# Patient Record
Sex: Female | Born: 1985 | Race: White | Hispanic: No | Marital: Married | Smoking: Never smoker
Health system: Southern US, Community
[De-identification: ages and names within clinical notes are randomized; demographics above are authoritative.]

## PROBLEM LIST (undated history)

## (undated) DIAGNOSIS — F419 Anxiety disorder, unspecified: Secondary | ICD-10-CM

## (undated) DIAGNOSIS — T7840XA Allergy, unspecified, initial encounter: Secondary | ICD-10-CM

## (undated) HISTORY — DX: Anxiety disorder, unspecified: F41.9

## (undated) HISTORY — DX: Allergy, unspecified, initial encounter: T78.40XA

## (undated) HISTORY — PX: CHOLECYSTECTOMY: SHX55

---

## 2013-05-28 ENCOUNTER — Other Ambulatory Visit: Payer: Self-pay | Admitting: Gastroenterology

## 2013-05-28 DIAGNOSIS — R110 Nausea: Secondary | ICD-10-CM

## 2013-06-07 ENCOUNTER — Encounter (HOSPITAL_COMMUNITY)
Admission: RE | Admit: 2013-06-07 | Discharge: 2013-06-07 | Disposition: A | Discharge status: Home / Self Care | Payer: 59 | Source: Ambulatory Visit | Attending: Gastroenterology | Admitting: Gastroenterology

## 2013-06-07 ENCOUNTER — Ambulatory Visit (HOSPITAL_COMMUNITY)
Admission: RE | Admit: 2013-06-07 | Discharge: 2013-06-07 | Disposition: A | Discharge status: Home / Self Care | Payer: 59 | Source: Ambulatory Visit | Attending: Gastroenterology | Admitting: Gastroenterology

## 2013-06-07 DIAGNOSIS — R1033 Periumbilical pain: Secondary | ICD-10-CM | POA: Insufficient documentation

## 2013-06-07 DIAGNOSIS — R110 Nausea: Secondary | ICD-10-CM | POA: Insufficient documentation

## 2013-06-07 MED ORDER — TECHNETIUM TC 99M MEBROFENIN IV KIT
5.0000 | PACK | Freq: Once | INTRAVENOUS | Status: AC | PRN
  Administered 2013-06-07: 5 via INTRAVENOUS

## 2013-06-07 MED ORDER — SINCALIDE 5 MCG IJ SOLR: 0.0200 ug/kg | Freq: Once | INTRAMUSCULAR | Status: DC

## 2013-06-07 MED ORDER — SINCALIDE 5 MCG IJ SOLR
INTRAMUSCULAR | Status: AC
  Administered 2013-06-07: 1.2 ug via INTRAVENOUS
  Filled 2013-06-07: qty 5

## 2013-06-14 ENCOUNTER — Encounter (INDEPENDENT_AMBULATORY_CARE_PROVIDER_SITE_OTHER): Payer: Self-pay | Admitting: Surgery

## 2013-06-27 ENCOUNTER — Ambulatory Visit (INDEPENDENT_AMBULATORY_CARE_PROVIDER_SITE_OTHER): Payer: 59 | Admitting: Surgery

## 2013-06-28 ENCOUNTER — Ambulatory Visit (INDEPENDENT_AMBULATORY_CARE_PROVIDER_SITE_OTHER): Payer: 59 | Admitting: Surgery

## 2013-06-28 ENCOUNTER — Encounter (INDEPENDENT_AMBULATORY_CARE_PROVIDER_SITE_OTHER): Payer: Self-pay | Admitting: Surgery

## 2013-06-28 VITALS — BP 102/66 | HR 92 | Temp 98.2°F | Resp 14 | Ht 64.0 in | Wt 128.0 lb

## 2013-06-28 DIAGNOSIS — K828 Other specified diseases of gallbladder: Secondary | ICD-10-CM

## 2013-06-28 NOTE — Progress Notes (Signed)
Patient ID: Katie Sosa, female   DOB: 05-May-1986, 27 y.o.   MRN: [id number]  Chief Complaint  Patient presents with  . New Evaluation    eval GB    HPI Katie Sosa is a 27 y.o. female.   HPI This is a very pleasant female referred by Dr. Arty Baumgartner for evaluation of nausea and right upper quadrant abdominal pain. She has been having this daily for last 4 months. She started having symptoms of nausea after fatty meals about a year ago. The pain is only mild. The nausea is the more significant symptoms. She denies vomiting. She has intermittent constipation and diarrhea. She is otherwise without complaints  History reviewed. No pertinent past medical history.  History reviewed. No pertinent past surgical history.  Family History  Problem Relation Age of Onset  . Cancer Father     prostate  . Cancer Paternal Grandmother     breast    Social History History  Substance Use Topics  . Smoking status: Never Smoker   . Smokeless tobacco: Never Used  . Alcohol Use: Yes     Comment: occasionally    No Known Allergies  Current Outpatient Prescriptions  Medication Sig Dispense Refill  . loratadine (CLARITIN) 10 MG tablet Take 10 mg by mouth daily.      . norethindrone-ethinyl estradiol 1/35 (ORTHO-NOVUM, NORTREL,CYCLAFEM) tablet Take 1 tablet by mouth daily.       No current facility-administered medications for this visit.    Review of Systems Review of Systems  Constitutional: Negative for fever, chills and unexpected weight change.  HENT: Negative for hearing loss, congestion, sore throat, trouble swallowing and voice change.   Eyes: Negative for visual disturbance.  Respiratory: Negative for cough and wheezing.   Cardiovascular: Negative for chest pain, palpitations and leg swelling.  Gastrointestinal: Positive for nausea, abdominal pain, diarrhea and constipation. Negative for vomiting, blood in stool, abdominal distention and anal bleeding.  Genitourinary: Negative for  hematuria, vaginal bleeding and difficulty urinating.  Musculoskeletal: Negative for arthralgias.  Skin: Negative for rash and wound.  Neurological: Negative for seizures, syncope and headaches.  Hematological: Negative for adenopathy. Does not bruise/bleed easily.  Psychiatric/Behavioral: Negative for confusion.    Blood pressure 102/66, pulse 92, temperature 98.2 F (36.8 C), temperature source Temporal, resp. rate 14, height 5\' 4"  (1.626 m), weight 128 lb (58.06 kg), last menstrual period 05/11/2013.  Physical Exam Physical Exam  Constitutional: She is oriented to person, place, and time. She appears well-developed and well-nourished. No distress.  HENT:  Head: Normocephalic and atraumatic.  Right Ear: External ear normal.  Left Ear: External ear normal.  Nose: Nose normal.  Mouth/Throat: Oropharynx is clear and moist. No oropharyngeal exudate.  Eyes: Conjunctivae are normal. Pupils are equal, round, and reactive to light. Right eye exhibits no discharge. Left eye exhibits no discharge. No scleral icterus.  Neck: Normal range of motion. Neck supple. No tracheal deviation present. No thyromegaly present.  Cardiovascular: Normal rate, regular rhythm, normal heart sounds and intact distal pulses.   No murmur heard. Pulmonary/Chest: Effort normal and breath sounds normal. No respiratory distress. She has no wheezes.  Abdominal: Soft. Bowel sounds are normal. She exhibits no distension. There is no tenderness. There is no rebound.  Musculoskeletal: Normal range of motion. She exhibits no edema and no tenderness.  Neurological: She is alert and oriented to person, place, and time.  Skin: Skin is warm and dry. No rash noted. She is not diaphoretic. No erythema.  Psychiatric: Her behavior is normal. Judgment normal.    Data Reviewed I have the notes from her gastroenterologist. Her upper endoscopy was unremarkable. Her ultrasound shows no gallstones. Her HIDA scan shows a gallbladder  ejection fraction of 16%  Assessment    Biliary dyskinesia and I suspect chronic cholecystitis     Plan    I discussed biliary dyskinesia with the patient and gave her literature regarding surgery. I recommend laparoscopic cholecystectomy. I discussed this with her in detail. I discussed the risk of surgery which includes but is not limited to bleeding, infection, bile duct injury, bile leak, injury to other structures, the need to convert to an open procedure, and the chance this may not resolve her symptoms. She understands and wishes to proceed. Surgery will be scheduled.  I also discussed postoperative recovery        Amaranta Mehl A 06/28/2013, 2:16 PM

## 2013-07-01 ENCOUNTER — Encounter (INDEPENDENT_AMBULATORY_CARE_PROVIDER_SITE_OTHER): Payer: Self-pay | Admitting: Surgery

## 2013-07-01 ENCOUNTER — Telehealth (INDEPENDENT_AMBULATORY_CARE_PROVIDER_SITE_OTHER): Payer: Self-pay | Admitting: General Surgery

## 2013-07-01 NOTE — Telephone Encounter (Signed)
Called in Rx into the [facility] on [address] [phone]. Called in per Dr Magnus Ivan Zofran 4 mg 1 po q 4 to 6 hours prn nausea #20 with one refill. Called and LMOM on patient home phone that I called in her Rx

## 2013-07-10 ENCOUNTER — Encounter (INDEPENDENT_AMBULATORY_CARE_PROVIDER_SITE_OTHER): Payer: Self-pay

## 2013-07-18 ENCOUNTER — Other Ambulatory Visit (INDEPENDENT_AMBULATORY_CARE_PROVIDER_SITE_OTHER): Payer: Self-pay | Admitting: Surgery

## 2013-07-18 ENCOUNTER — Other Ambulatory Visit (INDEPENDENT_AMBULATORY_CARE_PROVIDER_SITE_OTHER): Payer: Self-pay | Admitting: *Deleted

## 2013-07-18 DIAGNOSIS — K811 Chronic cholecystitis: Secondary | ICD-10-CM

## 2013-07-18 MED ORDER — HYDROCODONE-ACETAMINOPHEN 5-325 MG PO TABS: 1.0000 | ORAL_TABLET | ORAL | Status: DC | PRN

## 2013-07-22 ENCOUNTER — Telehealth (INDEPENDENT_AMBULATORY_CARE_PROVIDER_SITE_OTHER): Payer: Self-pay | Admitting: General Surgery

## 2013-07-22 NOTE — Telephone Encounter (Signed)
Pt called for reassurance regarding steri-strips coming off and appearance of umbilical wound.  She describes normal incisional appearance at this time.

## 2013-07-23 ENCOUNTER — Telehealth (INDEPENDENT_AMBULATORY_CARE_PROVIDER_SITE_OTHER): Payer: Self-pay | Admitting: *Deleted

## 2013-07-23 NOTE — Telephone Encounter (Signed)
Patient called to states some difficulty with nausea.  Patient states she still has a prescription for Zofran but has just been taking it when she feels nauseated.  Suggested to patient to go ahead and take every 6 hours as it was prescribed then once the nausea is under control she can wean her self down off of it.  Patient states understanding and agreeable with plan at this time.

## 2013-08-01 ENCOUNTER — Encounter (INDEPENDENT_AMBULATORY_CARE_PROVIDER_SITE_OTHER): Payer: Self-pay | Admitting: Surgery

## 2013-08-01 ENCOUNTER — Ambulatory Visit (INDEPENDENT_AMBULATORY_CARE_PROVIDER_SITE_OTHER): Payer: BC Managed Care – PPO | Admitting: Surgery

## 2013-08-01 VITALS — BP 118/76 | HR 80 | Resp 16 | Ht 64.0 in | Wt 126.2 lb

## 2013-08-01 DIAGNOSIS — Z09 Encounter for follow-up examination after completed treatment for conditions other than malignant neoplasm: Secondary | ICD-10-CM

## 2013-08-01 NOTE — Progress Notes (Signed)
Subjective:     Patient ID: Katie Sosa, female   DOB: 26-Jun-1986, 26 y.o.   MRN: [id number]  HPI She is here for her first postop visit status post laparoscopic cholecystectomy. She is doing well. She had some nausea postoperatively but this is now resolving.  Review of Systems     Objective:   Physical Exam On exam, her incisions are well healed. The final pathology showed chronic cholecystitis    Assessment:     Patient stable postop     Plan:     She may resume normal activity. I will see her back as needed

## 2013-08-29 ENCOUNTER — Other Ambulatory Visit: Payer: Self-pay

## 2013-09-29 ENCOUNTER — Ambulatory Visit (INDEPENDENT_AMBULATORY_CARE_PROVIDER_SITE_OTHER): Payer: BC Managed Care – PPO | Admitting: Family Medicine

## 2013-09-29 VITALS — BP 116/70 | HR 89 | Temp 98.1°F | Resp 18 | Ht 63.5 in | Wt 123.8 lb

## 2013-09-29 DIAGNOSIS — K219 Gastro-esophageal reflux disease without esophagitis: Secondary | ICD-10-CM

## 2013-09-29 DIAGNOSIS — J309 Allergic rhinitis, unspecified: Secondary | ICD-10-CM

## 2013-09-29 DIAGNOSIS — R079 Chest pain, unspecified: Secondary | ICD-10-CM

## 2013-09-29 MED ORDER — FLUTICASONE PROPIONATE 50 MCG/ACT NA SUSP: 2.0000 | Freq: Every day | NASAL | Status: AC

## 2013-09-29 NOTE — Progress Notes (Signed)
Chief Complaint:  Chief Complaint  Patient presents with  . Sore Throat    pain in throat and down into chest, "burning" started on Monday    HPI: Katie Sosa is a 27 y.o. female who is here for  Burning and dull pain on chest with deep breaths. SHe has a history of GERD and thought it was GERD  and she was feeling better after a few days from that.  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 There is a lot of stress Nagging burning pain. No history of MI/PE. No other sxs: SOB, palpitations,wheezing, SOB,  n/v, no sick sxs ie fevers, chills, ear pain, sinus pressure, HAs, cough. Has a history of GERD, allergies. On claritin, on OCP. Low risk for PE.   Past Medical History  Diagnosis Date  . Allergy   . Anxiety    Past Surgical History  Procedure Laterality Date  . Cholecystectomy     History   Social History  . Marital Status: Married    Spouse Name: N/A    Number of Children: N/A  . Years of Education: N/A   Social History Main Topics  . Smoking status: Never Smoker   . Smokeless tobacco: Never Used  . Alcohol Use: Yes     Comment: occasionally  . Drug Use: No  . Sexual Activity: None   Other Topics Concern  . None   Social History Narrative  . None   Family History  Problem Relation Age of Onset  . Cancer Father     prostate  . Cancer Paternal Grandmother     breast  . Stroke Paternal Grandmother   . Hyperlipidemia Mother   . Hearing loss Paternal Grandfather   . Stroke Paternal Grandfather    No Known Allergies Prior to Admission medications   Medication Sig Start Date End Date Taking? Authorizing Provider  loratadine (CLARITIN) 10 MG tablet Take 10 mg by mouth daily.   Yes Historical Provider, MD  norethindrone-ethinyl estradiol 1/35 (ORTHO-NOVUM, NORTREL,CYCLAFEM) tablet Take 1 tablet by mouth daily.   Yes Historical Provider, MD  Probiotic Product (PROBIOTIC DAILY PO) Take by mouth.   Yes  Historical Provider, MD  fluticasone (FLONASE) 50 MCG/ACT nasal spray Place 2 sprays into both nostrils daily. 09/29/13   Elmon Shader P Delvecchio Madole, DO  ondansetron (ZOFRAN) 4 MG tablet Take 4 mg by mouth 4 (four) times daily.    Historical Provider, MD     ROS: The patient denies fevers, chills, night sweats, unintentional weight loss, palpitations, wheezing, dyspnea on exertion, nausea, vomiting, abdominal pain, dysuria, hematuria, melena, numbness, weakness, or tingling.   All other systems have been reviewed and were otherwise negative with the exception of those mentioned in the HPI and as above.    PHYSICAL EXAM: Filed Vitals:   09/29/13 1435  BP: 116/70  Pulse: 89  Temp: 98.1 F (36.7 C)  Resp: 18   Filed Vitals:   09/29/13 1435  Height: 5' 3.5" (1.613 m)  Weight: 123 lb 12.8 oz (56.155 kg)   Body mass index is 21.58 kg/(m^2).  General: Alert, no acute distress HEENT:  Normocephalic, atraumatic, oropharynx patent. EOMI, PERRLA, boggy nares, TM nl, sinus nontender Cardiovascular:  Regular rate and rhythm, no rubs murmurs or gallops.  No Carotid bruits, radial pulse intact. No pedal edema.  Respiratory: Clear to auscultation bilaterally.  No wheezes, rales, or rhonchi.  No cyanosis, no use  of accessory musculature GI: No organomegaly, abdomen is soft and non-tender, positive bowel sounds.  No masses. Skin: No rashes. Neurologic: Facial musculature symmetric. Psychiatric: Patient is appropriate throughout our interaction. Lymphatic: No cervical lymphadenopathy Musculoskeletal: Gait intact.   LABS: No results found for this or any previous visit.   EKG/XRAY:   Primary read interpreted by Dr. Conley Rolls at [facility].   ASSESSMENT/PLAN: Encounter Diagnoses  Name Primary?  . Chest pain Yes  . Allergic rhinitis   . GERD (gastroesophageal reflux disease)    OTC GERD medicine Rx flonase C/w claritin F/u prn Precautions given for worsening sxs, return to office or go to ER if have CP/SOB, rule  out PE/MI Low risk factors for both   Gross sideeffects, risk and benefits, and alternatives of medications d/w patient. Patient is aware that all medications have potential sideeffects and we are unable to predict every sideeffect or drug-drug interaction that may occur.  Shonika Kolasinski PHUONG, DO 10/02/2013 10:49 AM

## 2013-10-30 ENCOUNTER — Ambulatory Visit: Payer: BC Managed Care – PPO | Admitting: Family Medicine

## 2013-10-30 VITALS — BP 122/70 | HR 93 | Temp 98.5°F | Resp 18 | Ht 64.5 in | Wt 124.0 lb

## 2013-10-30 DIAGNOSIS — G43109 Migraine with aura, not intractable, without status migrainosus: Secondary | ICD-10-CM

## 2013-10-30 LAB — POCT URINE PREGNANCY: PREG TEST UR: NEGATIVE

## 2013-10-30 MED ORDER — NORETHINDRONE 0.35 MG PO TABS: 1.0000 | ORAL_TABLET | Freq: Every day | ORAL | Status: AC

## 2013-10-30 MED ORDER — BUTALBITAL-APAP-CAFFEINE 50-325-40 MG PO TABS: 1.0000 | ORAL_TABLET | Freq: Four times a day (QID) | ORAL | Status: AC | PRN

## 2013-10-30 NOTE — Patient Instructions (Signed)
We are going to change to a progesterone only contraceptive pill due to your migraine with aura.  Be sure to take your new pill around the same time every day.  If you miss pills or are late with pills your risk of pregnancy is greater.    Please do think about giving your OB a call about another easier option- nexplanon or an IUD might be a good option for you.    Try the fioricet for your headaches.  Remember this medication can make you feels a little bit sleepy. If your headaches do not get better, if they get worse or if they do not respond to medication please let me know.  If you have a severe or persistent bad headache or develop any confusion, severe nausea/ vomiting or any other concerns please let me know right away

## 2013-10-30 NOTE — Progress Notes (Signed)
[facility] [address] [place] [address] [phone]  Date:  10/30/2013   Name:  Katie Sosa   DOB:  06-15-1986   MRN:  [id number]  PCP:  Provider Not In System    Chief Complaint: Migraine   History of Present Illness:  Katie Sosa is a 28 y.o. very pleasant female patient who presents with the following:  She has a history of migraine HA since her teens.  She had had them on a fairly regular basis (one every few months) since her's 20s. However over the last year she had done well until her current migraine seemed to start this past Saturday.  She does have aura prior to her migraines which she describes as visual distortion and lights in front of her eyes.   She has used ibuprofen and excedrin migraine with temporary success She may get nauseated, but does not have vomiting.   She will have phono and photophobia sometimes.    She did hit her head the day prior to onset of her HA; she was cleaning a bathroom, stood up and hit her head on a sink.  It did not seem to be a severe injury, no LOC.  She does not think that she hit her head hard enough to be significant but wanted to mention it  She is a Consulting civil engineer. No new medication.  Her HA are sometimes stress related.   She did see a neurologist and had a normal EEG in the past.  Her mother and grandparents have migraines as well  She is on a combined OCP.  She has taken these for several years. In retrospect she does note that her migraines started around the same time she started her OCP, but she is not sure which came first.  This does feel like other migraine she has had in the past.    LMP was late October; she was on continuous OCP over the holidays.  No illness- no fever, ST, cough, etc She is here today with her husband History of cholecystectomy (although she is quite slim).  Otherwise she is generally healthy    Patient Active Problem List   Diagnosis Date Noted  . Biliary dyskinesia  06/28/2013    Past Medical History  Diagnosis Date  . Allergy   . Anxiety     Past Surgical History  Procedure Laterality Date  . Cholecystectomy      History  Substance Use Topics  . Smoking status: Never Smoker   . Smokeless tobacco: Never Used  . Alcohol Use: Yes     Comment: occasionally    Family History  Problem Relation Age of Onset  . Cancer Father     prostate  . Cancer Paternal Grandmother     breast  . Stroke Paternal Grandmother   . Hyperlipidemia Mother   . Hearing loss Paternal Grandfather   . Stroke Paternal Grandfather     No Known Allergies  Medication list has been reviewed and updated.  Current Outpatient Prescriptions on File Prior to Visit  Medication Sig Dispense Refill  . fluticasone (FLONASE) 50 MCG/ACT nasal spray Place 2 sprays into both nostrils daily.  16 g  6  . loratadine (CLARITIN) 10 MG tablet Take 10 mg by mouth daily.      . norethindrone-ethinyl estradiol 1/35 (ORTHO-NOVUM, NORTREL,CYCLAFEM) tablet Take 1 tablet by mouth daily.      . ondansetron (ZOFRAN) 4 MG tablet Take 4 mg by mouth 4 (  four) times daily.      . Probiotic Product (PROBIOTIC DAILY PO) Take by mouth.       No current facility-administered medications on file prior to visit.    Review of Systems:  As per HPI- otherwise negative.   Physical Examination: Filed Vitals:   10/30/13 1019  BP: 122/70  Pulse: 93  Temp: 98.5 F (36.9 C)  Resp: 18   Filed Vitals:   10/30/13 1019  Height: 5' 4.5" (1.638 m)  Weight: 124 lb (56.246 kg)   Body mass index is 20.96 kg/(m^2). Ideal Body Weight: Weight in (lb) to have BMI = 25: 147.6  GEN: WDWN, NAD, Non-toxic, A & O x 3, looks well, slim build HEENT: Atraumatic, Normocephalic. Neck supple. No masses, No LAD. Bilateral TM wnl, oropharynx normal.  PEERL,EOMI.   No visual evidence of her recent head contusion  Ears and Nose: No external deformity. CV: RRR, No M/G/R. No JVD. No thrill. No extra heart  sounds. PULM: CTA B, no wheezes, crackles, rhonchi. No retractions. No resp. distress. No accessory muscle use. ABD: S, NT, ND EXTR: No c/c/e NEURO Normal gait. Normal strength, sensation and DTR all extremities.  Normal facial movement.  Normal balance and romberg, normal tandem gait PSYCH: Normally interactive. Conversant. Not depressed or anxious appearing.  Calm demeanor.   Results for orders placed in visit on 10/30/13  POCT URINE PREGNANCY      Result Value Range   Preg Test, Ur Negative      Assessment and Plan: Migraine with aura - Plan: norethindrone (ORTHO MICRONOR) 0.35 MG tablet, butalbital-acetaminophen-caffeine (FIORICET) 50-325-40 MG per tablet, POCT urine pregnancy  discussed in detail with pt and her husband.  It does sound as though she suffers from migraine.  As she has aura, we should change her to a progesterone only OCP to reduce her risk of stroke. She would like to make this change, and will also consider using an IUD or nexplanon as a more long- term solution per her OBG.  Counseled to use condoms during at least the first 2 weeks of her new OCP   Discussed the R/ B of a CT of her head.  Risks would include cost and radiation, benefits would include finding any other cause of her HA such as a bleed or other pathology (change of any bleed or other dangerous pathology thought to be small). At this time she elects not to have a CT, but will keep me posted on her progress.   For now will try fioricet for her headaches.    See patient instructions for more details.    Signed Abbe AmsterdamJessica Copland, MD

## 2014-02-01 IMAGING — NM NM HEPATO W/GB/PHARM/[PERSON_NAME]
2 series · 2 of 2 positions shown · non-contrast
Comparison: Ultrasound 06/07/2013

CLINICAL DATA: 26-year-old with nausea and mid abdominal pain.

NUCLEAR MEDICINE HEPATOBILIARY IMAGING WITH GALLBLADDER EF
TECHNIQUE: Sequential images of the abdomen were obtained [DATE] minutes following intravenous administration of
radiopharmaceutical.  After slow intravenous infusion of
micrograms Cholecystokinin, gallbladder ejection fraction was
determined.
Radiopharmaceutical:  5 mCi 9c-55m Choletec

[hepatobiliary · 1 of 1 slices shown (1 of 2)]
[im 1/1]
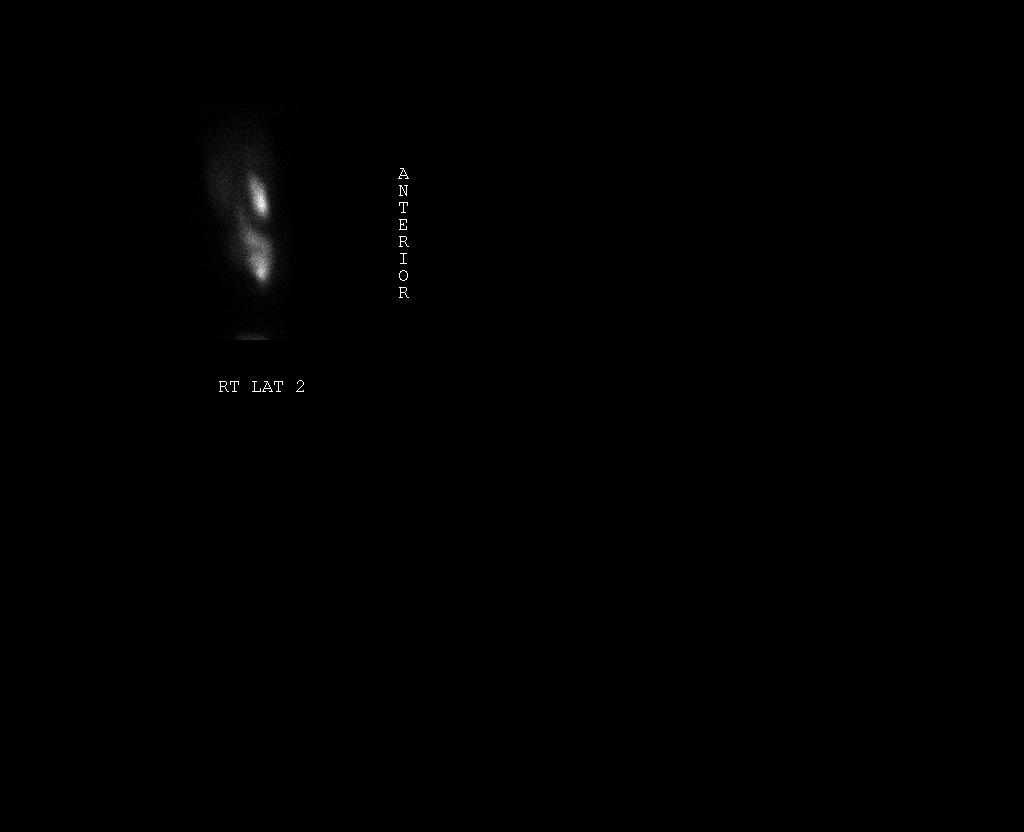

[hepatobiliary · 1 of 1 slices shown (2 of 2)]
[im 1/1]
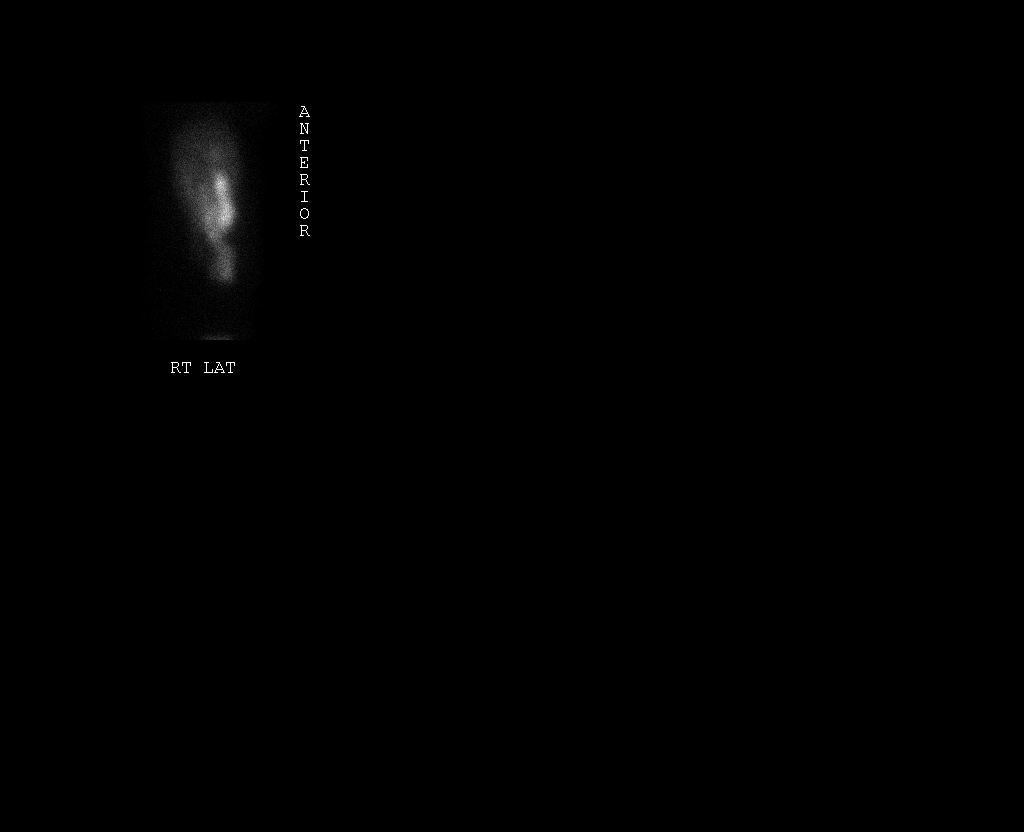

[2 of 2 positions shown; findings below may reference images not displayed]

FINDINGS: The radiopharmaceutical was taken up by the liver and
excreted into the biliary system.  There appears to be gallbladder
uptake around 35 minutes.  The lateral view confirms uptake in the
gallbladder.  Gallbladder ejection fraction is 16%.  Normal is
greater than or equal to 30%.

The patient did not experience symptoms during CCK infusion.
IMPRESSION: The cystic duct is patent.  There is uptake in the gallbladder.

Decreased gallbladder ejection fraction, measuring 16%.  Normal
ejection fraction is greater than or equal to 30%.  Findings can be
associated with biliary dyskinesia.

## 2014-02-01 IMAGING — NM NM HEPATO W/GB/PHARM/[PERSON_NAME]
3 series · 18 of 18 positions shown · non-contrast
Comparison: Ultrasound 06/07/2013

CLINICAL DATA: 26-year-old with nausea and mid abdominal pain.

NUCLEAR MEDICINE HEPATOBILIARY IMAGING WITH GALLBLADDER EF
TECHNIQUE: Sequential images of the abdomen were obtained [DATE] minutes following intravenous administration of
radiopharmaceutical.  After slow intravenous infusion of
micrograms Cholecystokinin, gallbladder ejection fraction was
determined.
Radiopharmaceutical:  5 mCi 9c-55m Choletec

[Series 0: hepatobiliary · 3.20mm/px · 6 of 6 frames shown (1 of 3)]
[frame 1/6]
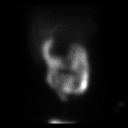
[frame 2/6]
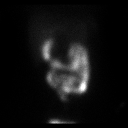
[frame 3/6]
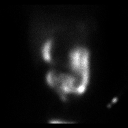
[frame 4/6]
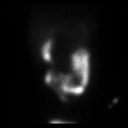
[frame 5/6]
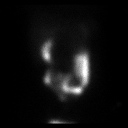
[frame 6/6]
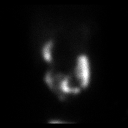

[Series 0: hepatobiliary · 3.20mm/px · 6 of 30 frames shown (2 of 3)]
[frame 3/30]
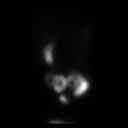
[frame 8/30]
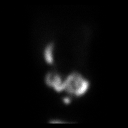
[frame 13/30]
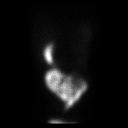
[frame 18/30]
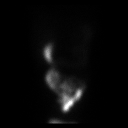
[frame 23/30]
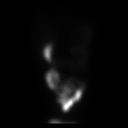
[frame 28/30]
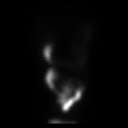

[Series 0: hepatobiliary · 3.20mm/px · 6 of 57 frames shown (3 of 3)]
[frame 5/57]
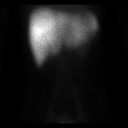
[frame 15/57]
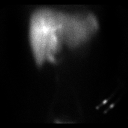
[frame 24/57]
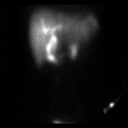
[frame 34/57]
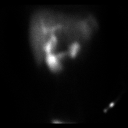
[frame 43/57]
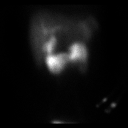
[frame 53/57]
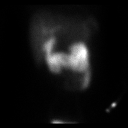

[18 of 18 positions shown; findings below may reference images not displayed]

FINDINGS: The radiopharmaceutical was taken up by the liver and
excreted into the biliary system.  There appears to be gallbladder
uptake around 35 minutes.  The lateral view confirms uptake in the
gallbladder.  Gallbladder ejection fraction is 16%.  Normal is
greater than or equal to 30%.

The patient did not experience symptoms during CCK infusion.
IMPRESSION: The cystic duct is patent.  There is uptake in the gallbladder.

Decreased gallbladder ejection fraction, measuring 16%.  Normal
ejection fraction is greater than or equal to 30%.  Findings can be
associated with biliary dyskinesia.
